# Patient Record
Sex: Female | Born: 2000 | Race: White | Hispanic: No | Marital: Single | Smoking: Former smoker
Health system: Southern US, Community
[De-identification: ages and names within clinical notes are randomized; demographics above are authoritative.]

## PROBLEM LIST (undated history)

## (undated) DIAGNOSIS — F32A Depression, unspecified: Secondary | ICD-10-CM

## (undated) DIAGNOSIS — F419 Anxiety disorder, unspecified: Secondary | ICD-10-CM

## (undated) HISTORY — DX: Depression, unspecified: F32.A

## (undated) HISTORY — DX: Anxiety disorder, unspecified: F41.9

---

## 2019-08-01 ENCOUNTER — Other Ambulatory Visit: Payer: Self-pay

## 2019-08-01 DIAGNOSIS — Z20822 Contact with and (suspected) exposure to covid-19: Secondary | ICD-10-CM

## 2019-08-03 LAB — NOVEL CORONAVIRUS, NAA: SARS-CoV-2, NAA: NOT DETECTED

## 2019-09-01 ENCOUNTER — Other Ambulatory Visit: Payer: Self-pay

## 2019-09-01 DIAGNOSIS — Z20822 Contact with and (suspected) exposure to covid-19: Secondary | ICD-10-CM

## 2019-09-03 LAB — NOVEL CORONAVIRUS, NAA: SARS-CoV-2, NAA: NOT DETECTED

## 2020-12-06 ENCOUNTER — Emergency Department
Admission: EM | Admit: 2020-12-06 | Discharge: 2020-12-06 | Disposition: A | Discharge status: Home / Self Care | Payer: Managed Care, Other (non HMO) | Attending: Emergency Medicine | Admitting: Emergency Medicine

## 2020-12-06 ENCOUNTER — Emergency Department: Payer: Managed Care, Other (non HMO)

## 2020-12-06 ENCOUNTER — Other Ambulatory Visit: Payer: Self-pay

## 2020-12-06 ENCOUNTER — Encounter: Payer: Self-pay | Admitting: Intensive Care

## 2020-12-06 DIAGNOSIS — M549 Dorsalgia, unspecified: Secondary | ICD-10-CM | POA: Insufficient documentation

## 2020-12-06 DIAGNOSIS — N3001 Acute cystitis with hematuria: Secondary | ICD-10-CM

## 2020-12-06 DIAGNOSIS — R110 Nausea: Secondary | ICD-10-CM | POA: Diagnosis not present

## 2020-12-06 DIAGNOSIS — Z87891 Personal history of nicotine dependence: Secondary | ICD-10-CM | POA: Insufficient documentation

## 2020-12-06 DIAGNOSIS — R1030 Lower abdominal pain, unspecified: Secondary | ICD-10-CM | POA: Diagnosis present

## 2020-12-06 LAB — COMPREHENSIVE METABOLIC PANEL
ALT: 17 U/L (ref 0–44)
AST: 22 U/L (ref 15–41)
Albumin: 4.7 g/dL (ref 3.5–5.0)
Alkaline Phosphatase: 74 U/L (ref 38–126)
Anion gap: 13 (ref 5–15)
BUN: 9 mg/dL (ref 6–20)
CO2: 24 mmol/L (ref 22–32)
Calcium: 9.6 mg/dL (ref 8.9–10.3)
Chloride: 99 mmol/L (ref 98–111)
Creatinine, Ser: 0.69 mg/dL (ref 0.44–1.00)
GFR, Estimated: >60 mL/min (ref >60)
Glucose, Bld: 97 mg/dL (ref 70–99)
Potassium: 4 mmol/L (ref 3.5–5.1)
Sodium: 136 mmol/L (ref 135–145)
Total Bilirubin: 0.9 mg/dL (ref 0.3–1.2)
Total Protein: 8.4 g/dL — ABNORMAL HIGH (ref 6.5–8.1)

## 2020-12-06 LAB — CBC
HCT: 40.2 % (ref 36.0–46.0)
Hemoglobin: 13.9 g/dL (ref 12.0–15.0)
MCH: 31.4 pg (ref 26.0–34.0)
MCHC: 34.6 g/dL (ref 30.0–36.0)
MCV: 90.7 fL (ref 80.0–100.0)
Platelets: 302 10*3/uL (ref 150–400)
RBC: 4.43 MIL/uL (ref 3.87–5.11)
RDW: 12 % (ref 11.5–15.5)
WBC: 13 10*3/uL — ABNORMAL HIGH (ref 4.0–10.5)
nRBC: 0 % (ref 0.0–0.2)

## 2020-12-06 LAB — POC URINE PREG, ED: Preg Test, Ur: NEGATIVE

## 2020-12-06 LAB — URINALYSIS, COMPLETE (UACMP) WITH MICROSCOPIC
Bilirubin Urine: NEGATIVE
Glucose, UA: NEGATIVE mg/dL
Ketones, ur: NEGATIVE mg/dL
Nitrite: NEGATIVE
Protein, ur: 100 mg/dL — AB
Specific Gravity, Urine: 1.01 (ref 1.005–1.030)
WBC, UA: >50 WBC/hpf — ABNORMAL HIGH (ref 0–5)
pH: 7 (ref 5.0–8.0)

## 2020-12-06 LAB — LIPASE, BLOOD: Lipase: 33 U/L (ref 11–51)

## 2020-12-06 MED ORDER — CEFTRIAXONE SODIUM 1 G IJ SOLR
1.0000 g | Freq: Once | INTRAMUSCULAR | Status: AC
  Administered 2020-12-06: 1 g via INTRAMUSCULAR
  Filled 2020-12-06: qty 10

## 2020-12-06 MED ORDER — PHENAZOPYRIDINE HCL 200 MG PO TABS: 200.0000 mg | ORAL_TABLET | Freq: Three times a day (TID) | ORAL | 0 refills | Status: AC | PRN

## 2020-12-06 MED ORDER — SULFAMETHOXAZOLE-TRIMETHOPRIM 800-160 MG PO TABS: 1.0000 | ORAL_TABLET | Freq: Two times a day (BID) | ORAL | 0 refills | Status: AC

## 2020-12-06 MED ORDER — LIDOCAINE HCL (PF) 1 % IJ SOLN
2.1000 mL | Freq: Once | INTRAMUSCULAR | Status: AC
  Administered 2020-12-06: 2.1 mL
  Filled 2020-12-06: qty 5

## 2020-12-06 NOTE — ED Provider Notes (Signed)
[facility] Emergency Department Provider Note ____________________________________________   Event Date/Time   First MD Initiated Contact with Patient 12/06/20 1548     (approximate)  I have reviewed the triage vital signs and the nursing notes.   HISTORY  Chief Complaint Abdominal Pain  HPI Angel Thompson is a 20 y.o. female with history of UTI symptoms for the past week presents to the emergency department for treatment and evaluation of lower abdominal pain and right side back pain. No relief with Azzo. Pain got worse overnight. She's having nausea without vomiting. No known fever. Pain in lower abdomen is worse with  Movement. Urinary frequency with pain post void.          History reviewed. No pertinent past medical history.  There are no problems to display for this patient.   History reviewed. No pertinent surgical history.  Prior to Admission medications   Medication Sig Start Date End Date Taking? Authorizing Provider  phenazopyridine (PYRIDIUM) 200 MG tablet Take 1 tablet (200 mg total) by mouth 3 (three) times daily as needed for pain. 12/06/20  Yes Elihu Milstein B, FNP  sulfamethoxazole-trimethoprim (BACTRIM DS) 800-160 MG tablet Take 1 tablet by mouth 2 (two) times daily for 3 days. 12/06/20 12/09/20 Yes Alianny Toelle B, FNP    Allergies Penicillins  History reviewed. No pertinent family history.  Social History Social History   Tobacco Use  . Smoking status: Former Smoker    Types: E-cigarettes  . Smokeless tobacco: Never Used  Substance Use Topics  . Alcohol use: Yes    Alcohol/week: 6.0 standard drinks    Types: 6 Cans of beer per week  . Drug use: Never    Review of Systems  Constitutional: No fever/chills Eyes: No visual changes. ENT: No sore throat. Cardiovascular: Denies chest pain. Respiratory: Denies shortness of breath. Gastrointestinal: Positive for abdominal pain.  Positive for nausea, no vomiting.  No  diarrhea. Positive for constipation. Genitourinary: Positive for dysuria. Musculoskeletal: Positive for back pain. Skin: Negative for rash. Neurological: Negative for headaches, focal weakness or numbness. ____________________________________________   PHYSICAL EXAM:  VITAL SIGNS: ED Triage Vitals  Enc Vitals Group     BP 12/06/20 1436 (!) 145/98     Pulse Rate 12/06/20 1436 (!) 103     Resp 12/06/20 1436 16     Temp 12/06/20 1436 98.4 F (36.9 C)     Temp Source 12/06/20 1436 Oral     SpO2 12/06/20 1436 99 %     Weight 12/06/20 1438 140 lb (63.5 kg)     Height 12/06/20 1438 5' 3.5" (1.613 m)     Head Circumference --      Peak Flow --      Pain Score 12/06/20 1437 5     Pain Loc --      Pain Edu? --      Excl. in GC? --     Constitutional: Alert and oriented. Well appearing and in no acute distress. Eyes: Conjunctivae are normal. PERRL. EOMI. Head: Atraumatic. Nose: No congestion/rhinnorhea. Mouth/Throat: Mucous membranes are moist.  Oropharynx non-erythematous. Neck: No stridor.   Hematological/Lymphatic/Immunilogical: No cervical lymphadenopathy. Cardiovascular: Normal rate, regular rhythm. Grossly normal heart sounds.  Good peripheral circulation. Respiratory: Normal respiratory effort.  No retractions. Lungs CTAB. Gastrointestinal: Soft with suprapubic tenderness. No distention. No abdominal bruits. Right side CVA tenderness. Genitourinary:  Musculoskeletal: No lower extremity tenderness nor edema.  No joint effusions. Neurologic:  Normal speech and language. No gross focal neurologic  deficits are appreciated. No gait instability. Skin:  Skin is warm, dry and intact. No rash noted. Psychiatric: Mood and affect are normal. Speech and behavior are normal.  ____________________________________________   LABS (all labs ordered are listed, but only abnormal results are displayed)  Labs Reviewed  COMPREHENSIVE METABOLIC PANEL - Abnormal; Notable for the following  components:      Result Value   Total Protein 8.4 (*)    All other components within normal limits  CBC - Abnormal; Notable for the following components:   WBC 13.0 (*)    All other components within normal limits  URINALYSIS, COMPLETE (UACMP) WITH MICROSCOPIC - Abnormal; Notable for the following components:   Color, Urine YELLOW (*)    APPearance CLOUDY (*)    Hgb urine dipstick LARGE (*)    Protein, ur 100 (*)    Leukocytes,Ua LARGE (*)    WBC, UA >50 (*)    Bacteria, UA FEW (*)    All other components within normal limits  LIPASE, BLOOD  POC URINE PREG, ED   ____________________________________________  EKG  Not indicated. ____________________________________________  RADIOLOGY  ED MD interpretation:    CT renal stone study negative.  I, Kem Boroughs, personally viewed and evaluated these images (plain radiographs) as part of my medical decision making, as well as reviewing the written report by the radiologist.  Official radiology report(s): CT Renal Stone Study  Result Date: 12/06/2020 CLINICAL DATA:  Right-sided flank pain EXAM: CT ABDOMEN AND PELVIS WITHOUT CONTRAST TECHNIQUE: Multidetector CT imaging of the abdomen and pelvis was performed following the standard protocol without IV contrast. COMPARISON:  None. FINDINGS: Lower chest: No acute abnormality. Hepatobiliary: No focal liver abnormality is seen. No gallstones, gallbladder wall thickening, or biliary dilatation. Pancreas: Unremarkable. No pancreatic ductal dilatation or surrounding inflammatory changes. Spleen: Normal in size without focal abnormality. Adrenals/Urinary Tract: Adrenal glands are within normal limits. Kidneys demonstrate a normal appearance bilaterally. No renal calculi or obstructive changes are seen. The bladder is well distended. Stomach/Bowel: The appendix is not well seen although no inflammatory changes to suggest appendicitis are noted. No obstructive or inflammatory changes of colon are  seen. The small bowel and stomach are within normal limits. Vascular/Lymphatic: No significant vascular findings are present. No enlarged abdominal or pelvic lymph nodes. Reproductive: Uterus and bilateral adnexa are unremarkable. Other: No abdominal wall hernia or abnormality. No abdominopelvic ascites. Musculoskeletal: No acute or significant osseous findings. IMPRESSION: No acute abnormality noted. Electronically Signed   By: Alcide Clever M.D.   On: 12/06/2020 16:32    ____________________________________________   PROCEDURES  Procedure(s) performed (including Critical Care):  Procedures  ____________________________________________   INITIAL IMPRESSION / ASSESSMENT AND PLAN     20 year old female presents to the ER for evaluation of abdominal pain, dysuria, and right flank pain. See HPI. Plan will be to review labs, urinalysis collected while awaiting ER room assignment.  DIFFERENTIAL DIAGNOSIS  Acute cystitis, pyelonephritis, nephrolithiasis  ED COURSE  CT is reassuring. No evidence of kidney stone, pyelonephritis, or appendicitis.  She will be treated with Rocephin while here and given 3 days of Bactrim. Pyridium should help with symptoms.   She was advised to follow up with PCP or return to the ER for symptoms that change or worsen if unable to schedule an appointment.    ___________________________________________   FINAL CLINICAL IMPRESSION(S) / ED DIAGNOSES  Final diagnoses:  Acute cystitis with hematuria     ED Discharge Orders  Ordered    sulfamethoxazole-trimethoprim (BACTRIM DS) 800-160 MG tablet  2 times daily        12/06/20 1642    phenazopyridine (PYRIDIUM) 200 MG tablet  3 times daily PRN        12/06/20 1642           Rashawnda Bardwell was evaluated in Emergency Department on 12/06/2020 for the symptoms described in the history of present illness. She was evaluated in the context of the global COVID-19 pandemic, which necessitated  consideration that the patient might be at risk for infection with the SARS-CoV-2 virus that causes COVID-19. Institutional protocols and algorithms that pertain to the evaluation of patients at risk for COVID-19 are in a state of rapid change based on information released by regulatory bodies including the CDC and federal and state organizations. These policies and algorithms were followed during the patient's care in the ED.   Note:  This document was prepared using Dragon voice recognition software and may include unintentional dictation errors.   Chinita Pester, FNP 12/06/20 1647    Sharyn Creamer, MD 12/06/20 1754

## 2020-12-06 NOTE — ED Triage Notes (Signed)
Patient c/o right lower abdominal pain and right lower back pain with nausea. Reports constipation. All started a few days ago. Urgent care sent patient here for possible appendicitis.

## 2020-12-06 NOTE — Discharge Instructions (Signed)
Take tylenol or ibuprofen in addition to the pyridium if needed for pain.  Return to the ER or see primary care if needed for symptoms that change or worsen.

## 2022-08-26 IMAGING — CT CT RENAL STONE PROTOCOL
3 of 4 series · 9 of 46 positions shown, 16 images · non-contrast
Comparison: None.

CLINICAL DATA: Right-sided flank pain

EXAM:
CT ABDOMEN AND PELVIS WITHOUT CONTRAST
TECHNIQUE: Multidetector CT imaging of the abdomen and pelvis was performed
following the standard protocol without IV contrast.

[Series 4: lung bases · axial · 0.78mm/px · z∈[-174,-99]mm · 5 of 23 slices shown, 10 images]
[im 4/23  soft-tissue]
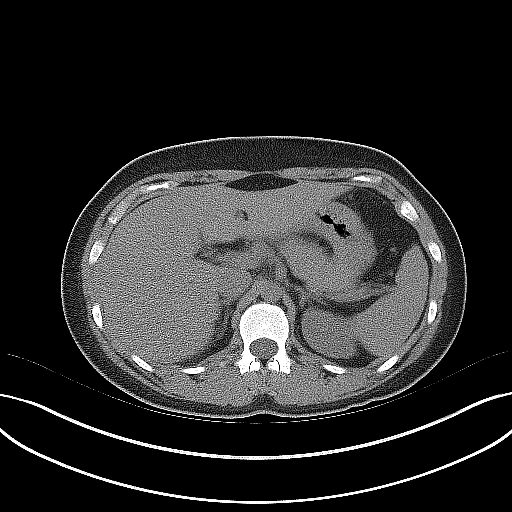
[im 4/23  bone]
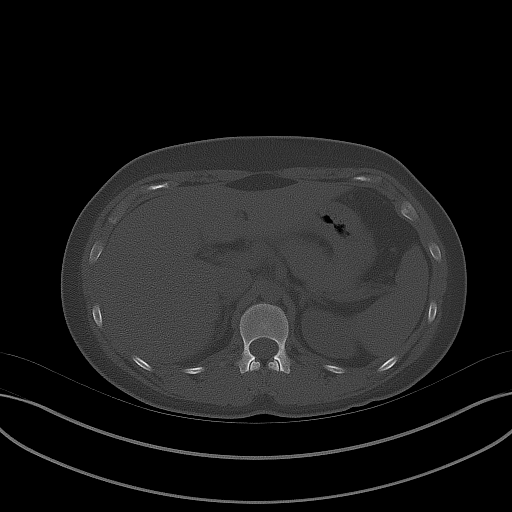
[im 8/23  soft-tissue]
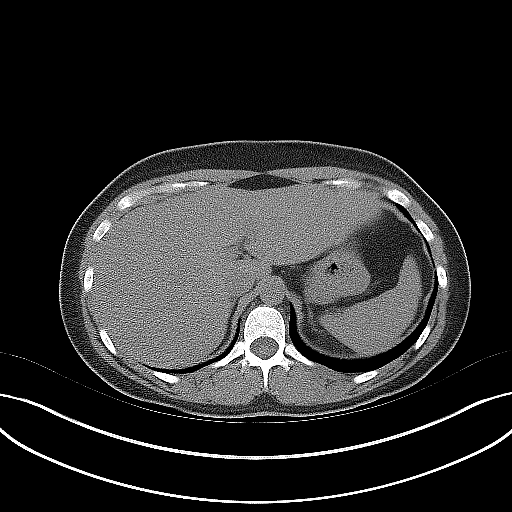
[im 8/23  lung]
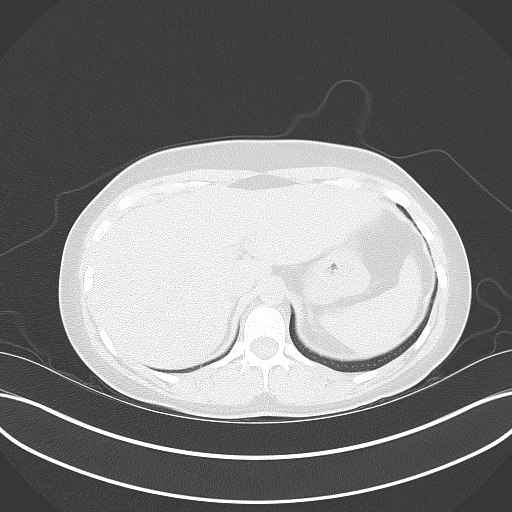
[im 12/23  soft-tissue]
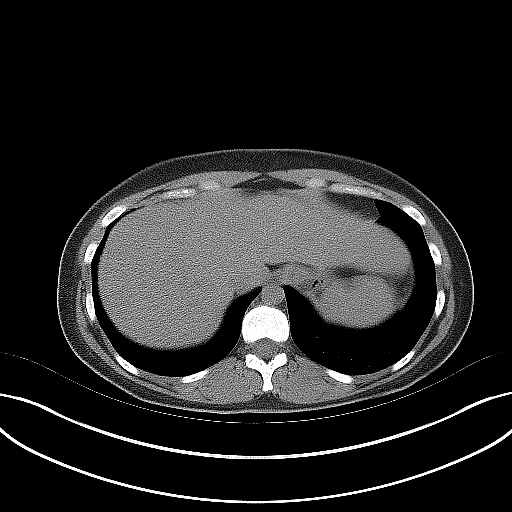
[im 12/23  lung]
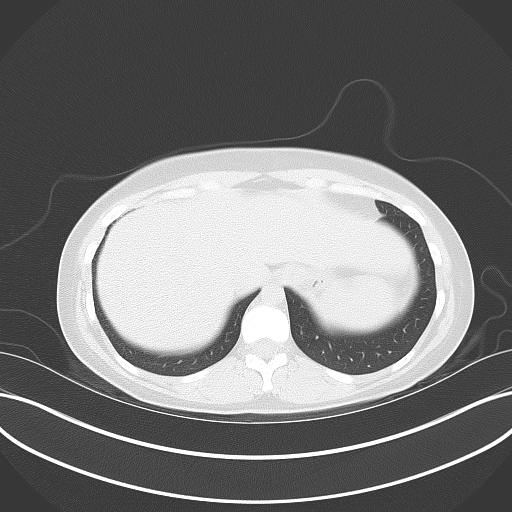
[im 15/23  soft-tissue]
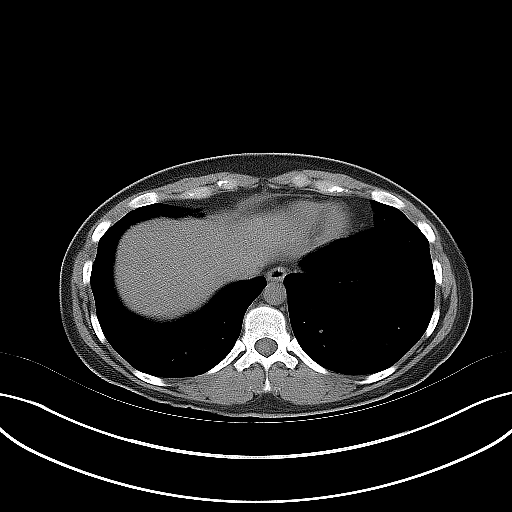
[im 15/23  lung]
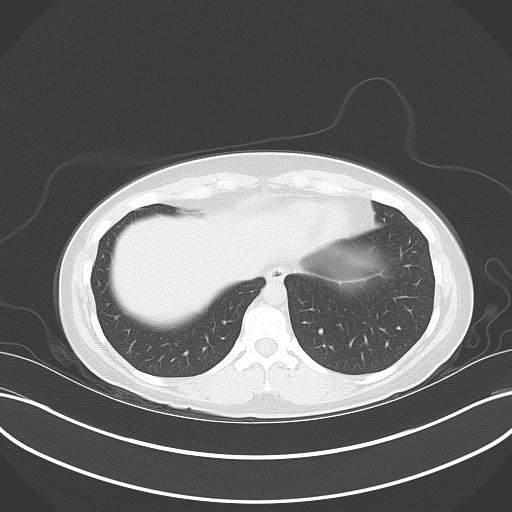
[im 19/23  soft-tissue]
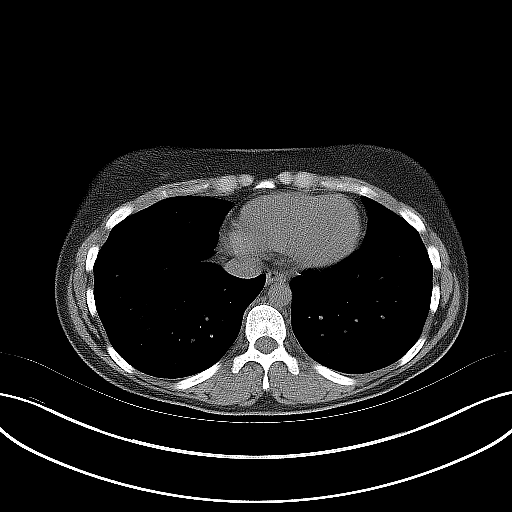
[im 19/23  lung]
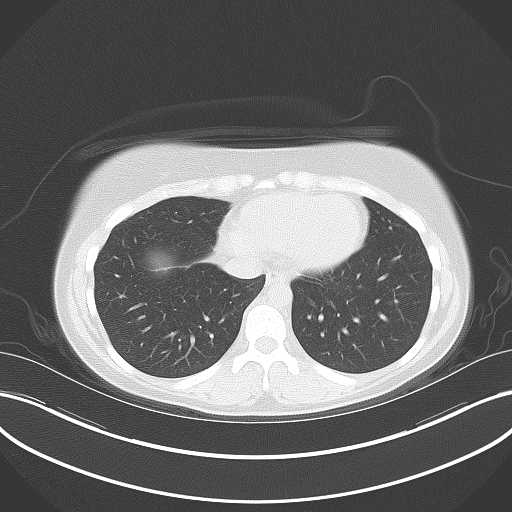

[Series 5: coronal · coronal · 0.82mm/px · 3 of 108 slices shown, 4 images]
[im 36/108  soft-tissue]
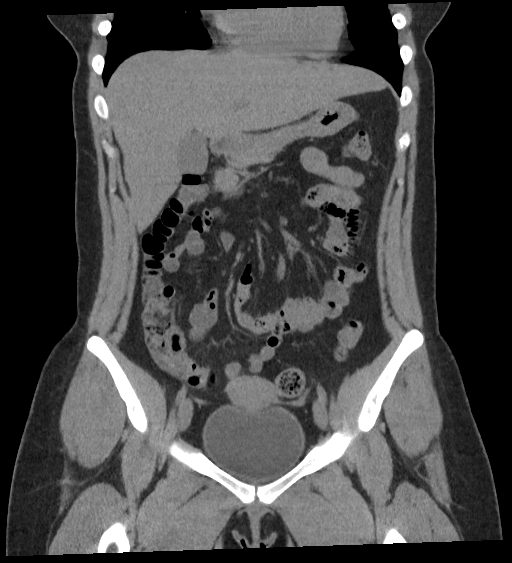
[im 48/108  soft-tissue]
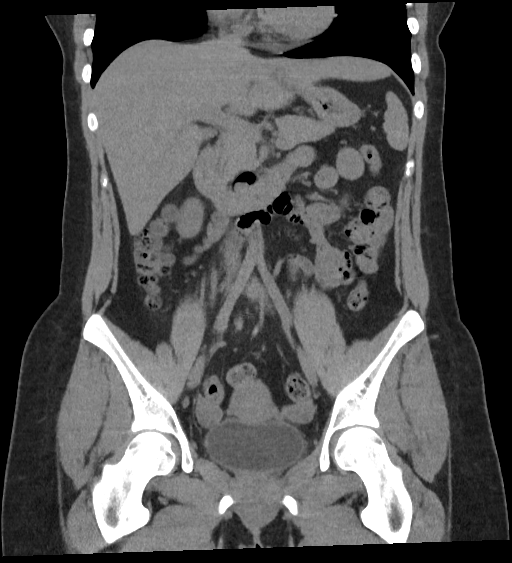
[im 48/108  bone]
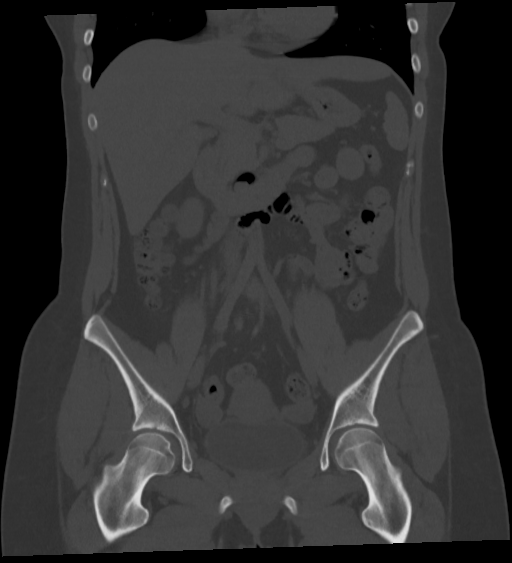
[im 60/108  soft-tissue]
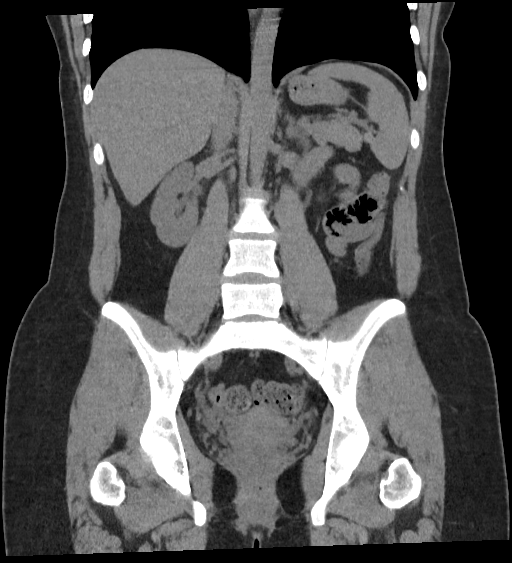

[Series 6: sagittal · sagittal · 0.48mm/px · 1 of 188 slices shown, 2 images]
[im 63/188  soft-tissue]
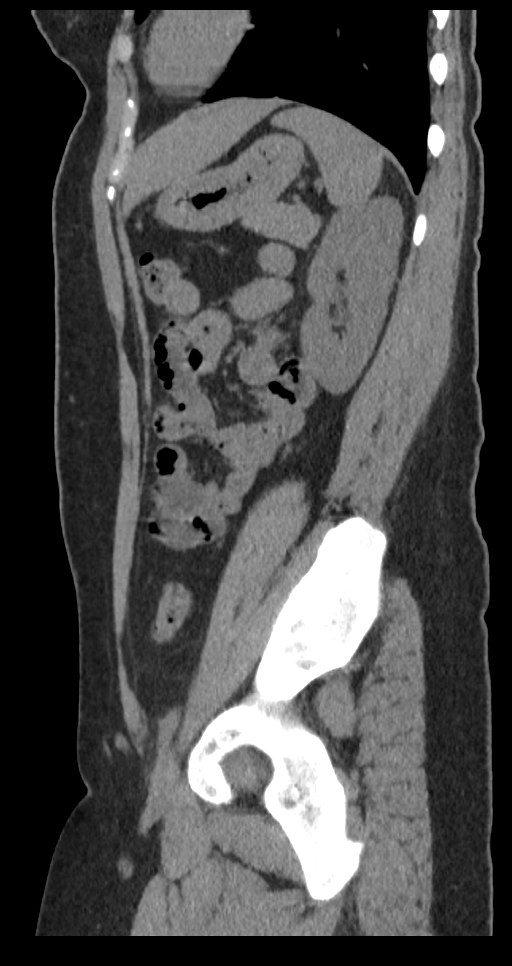
[im 63/188  bone]
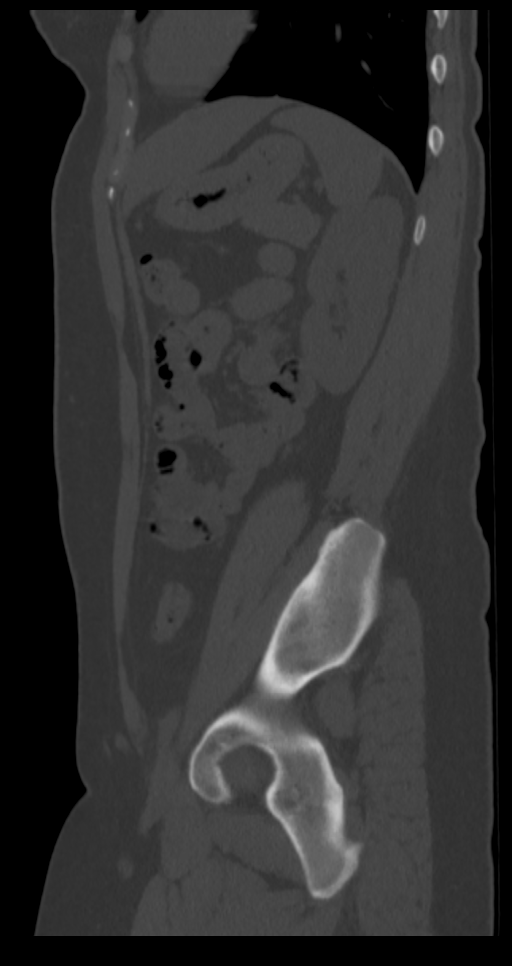

[9 of 46 positions shown; findings below may reference images not displayed]

FINDINGS: Lower chest: No acute abnormality.

Hepatobiliary: No focal liver abnormality is seen. No gallstones,
gallbladder wall thickening, or biliary dilatation.

Pancreas: Unremarkable. No pancreatic ductal dilatation or
surrounding inflammatory changes.

Spleen: Normal in size without focal abnormality.

Adrenals/Urinary Tract: Adrenal glands are within normal limits.
Kidneys demonstrate a normal appearance bilaterally. No renal
calculi or obstructive changes are seen. The bladder is well
distended.

Stomach/Bowel: The appendix is not well seen although no
inflammatory changes to suggest appendicitis are noted. No
obstructive or inflammatory changes of colon are seen. The small
bowel and stomach are within normal limits.

Vascular/Lymphatic: No significant vascular findings are present. No
enlarged abdominal or pelvic lymph nodes.

Reproductive: Uterus and bilateral adnexa are unremarkable.

Other: No abdominal wall hernia or abnormality. No abdominopelvic
ascites.

Musculoskeletal: No acute or significant osseous findings.
IMPRESSION: No acute abnormality noted.

## 2023-01-21 ENCOUNTER — Encounter (INDEPENDENT_AMBULATORY_CARE_PROVIDER_SITE_OTHER): Payer: Self-pay

## 2023-01-21 ENCOUNTER — Ambulatory Visit (INDEPENDENT_AMBULATORY_CARE_PROVIDER_SITE_OTHER): Payer: Commercial Managed Care - POS | Admitting: Family Nurse Practitioner

## 2023-01-21 VITALS — BP 129/83 | HR 76 | Temp 98.3°F | Resp 16 | Ht 63.0 in | Wt 170.0 lb

## 2023-01-21 DIAGNOSIS — H1033 Unspecified acute conjunctivitis, bilateral: Secondary | ICD-10-CM

## 2023-01-21 MED ORDER — POLYMYXIN B-TRIMETHOPRIM 10000-0.1 UNIT/ML-% OP SOLN
2.0000 [drp] | Freq: Four times a day (QID) | OPHTHALMIC | 0 refills | Status: AC
Start: 2023-01-21 — End: 2023-01-26

## 2023-01-21 NOTE — Progress Notes (Signed)
Urgent Care Provider Note    Patient: [employer]   Date: 01/21/2023   MRN: [id number]       Subjective     Chief Complaint   Patient presents with    Eye Problem     Redness. 3 days. No Med OTC         Eye Problem         Tabitha Manoukian is a 22 y.o. female arrived to the office today with c/o nasal congestion, mild cough and bilateral eye redness with discharge. Patient stated that she woke up this AM with yellow discharge in her eyes with continued eye redness. She stated that she had whipped them clean but they remain red. Stated she does not have any continued discharge from the eye but they are red and itchy. She denies use of contacts but does wear glasses. No close contacts that are sick.       Pertinent Past Medical, Surgical, Family and Social History were reviewed.      Current Outpatient Medications:     trimethoprim-polymyxin b (POLYTRIM) ophthalmic solution, Place 2 drops into both eyes every 6 (six) hours for 5 days, Disp: 10 mL, Rfl: 0    Allergies   Allergen Reactions    Augmentin [Amoxicillin-Pot Clavulanate] Rash       Medications and Allergies reviewed.         Objective     Vitals:    01/21/23 1125   BP: 129/83   Pulse: 76   Resp: 16   Temp: 98.3 F (36.8 C)   SpO2: 98%       Physical Exam  Vitals and nursing note reviewed.   Constitutional:       Appearance: Normal appearance.   HENT:      Head: Normocephalic.      Nose: Congestion present.      Mouth/Throat:      Mouth: Mucous membranes are moist.   Eyes:      Comments: Bilateral eye redness. No photophobia. No discharge noted at this time.    Neurological:      Mental Status: She is alert.   Psychiatric:         Behavior: Behavior is cooperative.          General: no acute distress, well developed, well nourished.    Eyes: no conjunctival injection or discharge    Ear: TMs clear bilaterally, normal light reflexes, clear canals without cerumen impaction    Throat: moist mucous membranes, no erythema, no exudates, no tonsillar swelling, no  drooling, no PTA    Neck: supple, no cervical adenopathy    Lung: no respiratory distress, clear to auscultation bilaterally, no rales, no wheeze, no rhonchi    Heart: regular rate and rhythm, no murmurs    Abdomen: soft, non-distended, nontender, normoactive bowel sounds, no rebound, no guarding    Neurologic: alert, oriented, speech clear    UCC COURSE  There were no labs reviewed with this patient during the visit.    There were no x-rays reviewed with this patient during the visit.    No current facility-administered medications for this visit.             Procedures  None      MDM:  Allergic Conjunctivitis  Viral Conjunctivitis  Uveitis  Dry Eye  Blepharitis      Assessment       Jacie was seen today for eye problem.    Diagnoses and all orders  for this visit:    Acute bacterial conjunctivitis of both eyes  -     trimethoprim-polymyxin b (POLYTRIM) ophthalmic solution; Place 2 drops into both eyes every 6 (six) hours for 5 days      Discussed watch and wait with the antibiotic if the discharge does not return could be related to cold symptoms. Advised to use warm compresses as needed for irritation. Motrin or Tylenol for pain. Antihistamine for itchy eyes.     Plan and follow-up discussed with patient. See AVS for further documentation.

## 2023-01-21 NOTE — Patient Instructions (Signed)
Follow up as needed

## 2023-03-08 ENCOUNTER — Encounter (INDEPENDENT_AMBULATORY_CARE_PROVIDER_SITE_OTHER): Payer: Self-pay | Admitting: Medical-Surgical

## 2023-03-08 ENCOUNTER — Ambulatory Visit (INDEPENDENT_AMBULATORY_CARE_PROVIDER_SITE_OTHER): Payer: Commercial Managed Care - POS | Admitting: Medical-Surgical

## 2023-03-08 VITALS — BP 122/76 | HR 81 | Temp 97.6°F | Wt 172.0 lb

## 2023-03-08 DIAGNOSIS — M542 Cervicalgia: Secondary | ICD-10-CM

## 2023-03-08 DIAGNOSIS — Z0000 Encounter for general adult medical examination without abnormal findings: Secondary | ICD-10-CM

## 2023-03-08 DIAGNOSIS — Z8349 Family history of other endocrine, nutritional and metabolic diseases: Secondary | ICD-10-CM

## 2023-03-08 DIAGNOSIS — Z1159 Encounter for screening for other viral diseases: Secondary | ICD-10-CM

## 2023-03-08 LAB — COMPREHENSIVE METABOLIC PANEL
ALT: 31 U/L (ref 0–55)
AST (SGOT): 21 U/L (ref 5–41)
Albumin/Globulin Ratio: 1.5 (ref 0.9–2.2)
Albumin: 4.5 g/dL (ref 3.5–5.0)
Alkaline Phosphatase: 106 U/L (ref 37–117)
Anion Gap: 12 (ref 5.0–15.0)
BUN: 14 mg/dL (ref 7.0–21.0)
Bilirubin, Total: 0.7 mg/dL (ref 0.2–1.2)
CO2: 24 mEq/L (ref 17–29)
Calcium: 10.2 mg/dL (ref 8.5–10.5)
Chloride: 101 mEq/L (ref 99–111)
Creatinine: 0.7 mg/dL (ref 0.4–1.0)
Globulin: 3.1 g/dL (ref 2.0–3.6)
Glucose: 89 mg/dL (ref 70–100)
Potassium: 4.7 mEq/L (ref 3.5–5.3)
Protein, Total: 7.6 g/dL (ref 6.0–8.3)
Sodium: 137 mEq/L (ref 135–145)
eGFR: >60 mL/min/{1.73_m2} (ref >=60)

## 2023-03-08 LAB — CBC AND DIFFERENTIAL
Absolute NRBC: 0 10*3/uL (ref 0.00–0.00)
Basophils Absolute Automated: 0.03 10*3/uL (ref 0.00–0.08)
Basophils Automated: 0.4 %
Eosinophils Absolute Automated: 0.09 10*3/uL (ref 0.00–0.44)
Eosinophils Automated: 1.3 %
Hematocrit: 44 % — ABNORMAL HIGH (ref 34.7–43.7)
Hgb: 15 g/dL — ABNORMAL HIGH (ref 11.4–14.8)
Immature Granulocytes Absolute: 0.01 10*3/uL (ref 0.00–0.07)
Immature Granulocytes: 0.1 %
Instrument Absolute Neutrophil Count: 3.86 10*3/uL (ref 1.10–6.33)
Lymphocytes Absolute Automated: 2.23 10*3/uL (ref 0.42–3.22)
Lymphocytes Automated: 33.3 %
MCH: 31.2 pg (ref 25.1–33.5)
MCHC: 34.1 g/dL (ref 31.5–35.8)
MCV: 91.5 fL (ref 78.0–96.0)
MPV: 10.2 fL (ref 8.9–12.5)
Monocytes Absolute Automated: 0.47 10*3/uL (ref 0.21–0.85)
Monocytes: 7 %
Neutrophils Absolute: 3.86 10*3/uL (ref 1.10–6.33)
Neutrophils: 57.9 %
Nucleated RBC: 0 /100 WBC (ref 0.0–0.0)
Platelets: 301 10*3/uL (ref 142–346)
RBC: 4.81 10*6/uL (ref 3.90–5.10)
RDW: 12 % (ref 11–15)
WBC: 6.69 10*3/uL (ref 3.10–9.50)

## 2023-03-08 LAB — LIPID PANEL
Cholesterol / HDL Ratio: 5 Index
Cholesterol: 223 mg/dL — ABNORMAL HIGH (ref 0–199)
HDL: 45 mg/dL (ref 40–9999)
LDL Calculated: 164 mg/dL — ABNORMAL HIGH (ref 0–99)
Triglycerides: 69 mg/dL (ref 34–149)
VLDL Calculated: 14 mg/dL (ref 10–40)

## 2023-03-08 LAB — HEMOLYSIS INDEX(SOFT): Hemolysis Index: 5 Index (ref 0–24)

## 2023-03-08 LAB — HEMOGLOBIN A1C
Average Estimated Glucose: 102.5 mg/dL
Hemoglobin A1C: 5.2 % (ref 4.6–5.6)

## 2023-03-08 LAB — HEPATITIS C ANTIBODY, TOTAL: Hepatitis C, AB: NONREACTIVE

## 2023-03-08 LAB — TSH: TSH: 1.79 u[IU]/mL (ref 0.35–4.94)

## 2023-03-08 NOTE — Progress Notes (Signed)
Date of Exam: 03/08/2023 12:16 PM        Patient ID: Priscilla Sims is a 22 y.o. female.  Attending Physician: Jodell Cipro, DNP FNP        Chief Complaint:    Chief Complaint   Patient presents with    Annual Exam               HPI:    HPI  Visit Type: Health Maintenance Visit  Pt presents today for annual physical.  Fasting - yes      Diet is healthy, well-balanced with normal portions? Overall okay   Exercising regularly?  4 days x week   Normal sleep patterns?  7-9 hours x night   Overall mood:  PHQ score: 10 (Mod Depression)     IMMUNIZATIONS - see form  Tdap - n/a  Flu- UTD   COVID- n/a     PREVENTATIVE SCREENINGS  MOST RECENT:   Dental Exam - more than a year ago   Eye Exam - 2022  Hearing concerns?  No   Colonoscopy/FIT (over age 45)-  no fam hx   Pap smear (over age 43)-  sees GYN 04/2022  Mammogram (over age 37)-  no fam hx     Additional concerns:   - pt would like Thyroid bw   - complaint - neck pain, tender to touch                Problem List:    There is no problem list on file for this patient.            Current Meds:    No outpatient medications have been marked as taking for the 03/08/23 encounter (Office Visit) with Jodell Cipro, DNP FNP.          Allergies:    Allergies   Allergen Reactions    Augmentin [Amoxicillin-Pot Clavulanate] Rash    Pollen Extract              Past Surgical History:    History reviewed. No pertinent surgical history.        Family History:    Family History   Problem Relation Age of Onset    Diabetes Maternal Grandfather     Cancer Paternal Grandfather            Social History:    Social History     Tobacco Use    Smoking status: Never    Smokeless tobacco: Former   Haematologist status: Former   Substance Use Topics    Alcohol use: Yes     Comment: weekends    Drug use: Yes     Types: Marijuana     Comment: 3-5/week           The following sections were reviewed this encounter by the provider:   Tobacco  Allergies  Meds  Problems   Med Hx  Surg Hx  Fam Hx             Vital Signs:    BP 122/76   Pulse 81   Temp 97.6 F (36.4 C)   Wt 78 kg (172 lb)   LMP 01/26/2023 (Approximate)   SpO2 98%   BMI 30.47 kg/m          ROS:    Review of Systems   Musculoskeletal:         Right sided neck tenderness      General/Constitutional:  Denies Chills. Denies Fever.   Ophthalmologic:   Denies Blurred vision.   ENT:   Denies Ear pain. Denies Nosebleed. Denies Sinus pain. Denies Sore throat.   Endocrine:   Denies Polydipsia. Denies Polyuria. Denies Weakness.   Respiratory:   Denies Shortness of breath. Denies Wheezing.   Cardiovascular:   Denies Chest pain. Denies Chest pain with exertion. Denies Leg Claudication.    Gastrointestinal:   Denies Abdominal pain. Denies Blood in stool.  Hematology:   Denies Easy bruising. Denies Easy Bleeding. Denies Swollen glands.   Genitourinary:   Denies Blood in urine. Denies Painful urination.   Musculoskeletal:   Denies Joint pain. Denies Joint stiffness. Denies Swollen joints.   Skin:   Denies Itching. Denies Change in Mole(s). Denies Rash.   Neurologic:   Denies Balance difficulty Denies Gait abnormality. Denies Pre-Syncope.              Physical Exam:    Physical Exam  Lymphadenopathy:      Cervical: Cervical adenopathy present.      Right cervical: Superficial cervical adenopathy present.        GENERAL APPEARANCE: alert, in no acute distress, well developed, well nourished, oriented to time, place, and person.   HEAD: normal appearance, atraumatic.   EYES: extraocular movement intact (EOMI), sclera anicteric, conjunctiva clear.   EARS: hearing normal, external canals normal .   NECK/THYROID: neck supple, no cervical lymphadenopathy, no neck mass  LYMPH NODES: no palpable adenopathy.   SKIN: good turgor, no rashes, no suspicious lesions.   HEART: S1, S2 normal, regular rate and rhythm.   LUNGS: normal effort / no distress, normal breath sounds, clear to auscultation   ABDOMEN: bowel sounds present, no  hepatosplenomegaly, soft, nontender  MUSCULOSKELETAL: full range of motion, no swelling or deformity.   EXTREMITIES: no edema, no clubbing, cyanosis, or edema.   NEUROLOGIC: nonfocal, cranial nerves 2-12 grossly intact, normal strength  PSYCH: cognitive function intact, mood/affect full range, speech clear.         Assessment:    1. Tenderness of neck  - [place] Head Neck Soft Tissue; Future    2. Family history of thyroid disease  - [place] Head Neck Soft Tissue; Future    3. Encounter for annual physical exam  - Comprehensive metabolic panel  - Lipid panel  - TSH  - CBC and differential  - Hemoglobin A1C    4. Need for hepatitis C screening test  - Hepatitis C (HCV) antibody, Total            Plan:    - This is a pleasant 22 year old female  - presents today for well women exam   - UTD with pap smear  - UTD with vaccines  - normal physical exam  - screening bw obtained  - pt with right sided tenderness on neck x 3-4 days. Pt also has fam hx of thyroid disease. Will check labs and imaging     Health Maintenance:   - Follow active lifestyle  - Diet rich in fruits/vegetables, avoid snacks and processed foods.  - Healthy sleep patterns  - Avoid excess alcohol intake  - Follow up as needed            Follow-up:    No follow-ups on file.         Jodell Cipro, DNP FNP

## 2023-03-09 NOTE — Progress Notes (Signed)
Hi Ms. Fiorello:  Laboratory results from your recent lab visit have returned for your review. (Minor variances, if any, outside of the expected range are not considered clinically significant)    The following lab results are within acceptable limits:   - A1C ( 3 month measure of glucose control ) is within normal limits.  There is no evidence of diabetes.  - (CBC) The complete blood count is within acceptable limits.  There is no evidence of anemia.  White blood cell count and platelet count are within normal limits.  - (CMP) Liver and kidney function are within acceptable limits.  Electrolytes and glucose are within acceptable limits.  - TSH ( a screen for thyroid disease) is within normal limits.   - LDL (bad cholesterol) is moderately elevated.  HDL (good cholesterol) is within normal limits. Triglycerides are within normal limits.   - Hepatitis C screening test (HCV Ab Total) is recommended once for patients ages 18-79 yrs.  There is no evidence of Hepatitis C infection.      Continue to optimize low carbohydrate diet and exercise efforts to help lower the LDL (bad cholesterol).    Regards,  Jodell Cipro, DNP FNP

## 2023-03-16 ENCOUNTER — Other Ambulatory Visit (INDEPENDENT_AMBULATORY_CARE_PROVIDER_SITE_OTHER): Payer: Self-pay

## 2023-03-16 ENCOUNTER — Ambulatory Visit (INDEPENDENT_AMBULATORY_CARE_PROVIDER_SITE_OTHER): Payer: Commercial Managed Care - POS | Admitting: Medical-Surgical

## 2023-03-16 DIAGNOSIS — M542 Cervicalgia: Secondary | ICD-10-CM

## 2023-03-16 DIAGNOSIS — Z8349 Family history of other endocrine, nutritional and metabolic diseases: Secondary | ICD-10-CM

## 2023-03-19 ENCOUNTER — Encounter (INDEPENDENT_AMBULATORY_CARE_PROVIDER_SITE_OTHER): Payer: Commercial Managed Care - POS | Admitting: Medical-Surgical

## 2023-03-19 ENCOUNTER — Encounter (INDEPENDENT_AMBULATORY_CARE_PROVIDER_SITE_OTHER): Payer: Self-pay | Admitting: Medical-Surgical

## 2023-03-19 NOTE — Progress Notes (Unsigned)
[facility] [place] [facility]                       Date of Exam: 03/19/2023 1:26 PM        Patient ID: Priscilla Sims is a 22 y.o. female.  Attending Physician: Jodell Cipro, DNP FNP        Chief Complaint:    No chief complaint on file.              HPI:    Pt presents today to discuss antidepressant medications    - pt restarted therapy 4 months ago   -  therapist is recommending start pt on meds               Problem List:    There is no problem list on file for this patient.            Current Meds:    Outpatient Medications Marked as Taking for the 03/19/23 encounter (Office Visit) with Jodell Cipro, DNP FNP   Medication Sig Dispense Refill    levonorgestrel (MIRENA) 20 MCG/DAY IUD 1 Intra Uterine Device (52 mg) by Intrauterine route once            Allergies:    Allergies   Allergen Reactions    Augmentin [Amoxicillin-Pot Clavulanate] Rash    Pollen Extract              Past Surgical History:    No past surgical history on file.        Family History:    Family History   Problem Relation Age of Onset    Diabetes Maternal Grandfather     Cancer Paternal Grandfather            Social History:    Social History     Tobacco Use    Smoking status: Never    Smokeless tobacco: Former   Haematologist status: Former   Substance Use Topics    Alcohol use: Yes     Comment: weekends    Drug use: Yes     Types: Marijuana     Comment: 3-5/week           The following sections were reviewed this encounter by the provider:            Vital Signs:    Temp 97.8 F (36.6 C)   Wt 78.5 kg (173 lb)   LMP 02/26/2023 (Approximate)   BMI 30.65 kg/m          ROS:    Review of Systems           Physical Exam:    Physical Exam         Assessment/Plan:    There are no diagnoses linked to this encounter.                Follow-up:    No follow-ups on file.         Jodell Cipro, DNP FNP

## 2023-06-26 ENCOUNTER — Encounter (INDEPENDENT_AMBULATORY_CARE_PROVIDER_SITE_OTHER): Payer: Self-pay

## 2023-06-26 ENCOUNTER — Ambulatory Visit (INDEPENDENT_AMBULATORY_CARE_PROVIDER_SITE_OTHER): Payer: Commercial Managed Care - POS | Admitting: Family

## 2023-06-26 VITALS — BP 120/74 | HR 59 | Temp 97.8°F | Wt 180.0 lb

## 2023-06-26 DIAGNOSIS — R519 Headache, unspecified: Secondary | ICD-10-CM

## 2023-06-26 DIAGNOSIS — G43109 Migraine with aura, not intractable, without status migrainosus: Secondary | ICD-10-CM

## 2023-06-26 MED ORDER — BUTALBITAL-APAP-CAFFEINE 50-325-40 MG PO TABS
1.0000 | ORAL_TABLET | ORAL | 1 refills | Status: AC | PRN
Start: 2023-06-26 — End: ?

## 2023-06-26 MED ORDER — CYCLOBENZAPRINE HCL 10 MG PO TABS: 10.0000 mg | ORAL_TABLET | Freq: Three times a day (TID) | ORAL | 0 refills | Status: AC | PRN

## 2023-06-26 NOTE — Progress Notes (Signed)
[facility] - AN [place] [facility]                       Date of Exam: 06/26/2023 12:13 PM        Patient ID: Priscilla Sims is a 22 y.o. female.  Attending Physician: Sherryle Lis, FNP        Chief Complaint:    Chief Complaint   Patient presents with    Headache               HPI:    Pt presents today to discuss headaches:  -onset: 9 days ago   -frequency: fluctuating   -pain location: back of head and crowing headache   -pain radiates to:  -pain quality: pulsating, shooting   -associated sxs: bilateral eye pain, insomnia, Nausea, photophobia,  loss of vision, vomiting, neck and shoulder tightness  -treatments tried: Excedrin   -improvement on treatment: none                 Problem List:    Problem List[1]          Current Meds:    Medications Taking[2]       Allergies:    Allergies[3]          Past Surgical History:    History reviewed. No pertinent surgical history.        Family History:    Family History   Problem Relation Age of Onset    Diabetes Maternal Grandfather     Cancer Paternal Grandfather            Social History:    Social History[4]        The following sections were reviewed this encounter by the provider:   Tobacco  Allergies  Meds  Problems  Med Hx  Surg Hx  Fam Hx             Vital Signs:    BP 120/74   Pulse (!) 59   Temp 97.8 F (36.6 C)   Wt 81.6 kg (180 lb)   LMP 06/23/2023 (Approximate)   SpO2 100%   BMI 31.89 kg/m          ROS:    Review of Systems   Constitutional:  Positive for fatigue. Negative for chills and fever.   Eyes:  Positive for photophobia (with migraines) and visual disturbance (missing spots/erased in vision with migraines).   Respiratory:  Negative for cough.    Cardiovascular:  Negative for chest pain.   Gastrointestinal:  Positive for nausea. Negative for abdominal pain and vomiting (last Tuesday).   Musculoskeletal:  Positive for neck pain and neck stiffness.   Neurological:  Positive for dizziness (mild), light-headedness  (mild), numbness (hands and lips) and headaches (hx of migraine, alternating sides).   Psychiatric/Behavioral:  Positive for sleep disturbance (due to headaches).         No stress              Physical Exam:    Physical Exam  Vitals and nursing note reviewed.   Constitutional:       General: She is not in acute distress.     Appearance: Normal appearance.   HENT:      Head: Normocephalic and atraumatic.      Right Ear: Tympanic membrane and ear canal normal.      Left Ear: Tympanic membrane and ear canal normal.      Nose: Nose normal.  Mouth/Throat:      Mouth: Mucous membranes are moist.      Pharynx: Oropharynx is clear. No oropharyngeal exudate or posterior oropharyngeal erythema.   Eyes:      Extraocular Movements: Extraocular movements intact.      Conjunctiva/sclera: Conjunctivae normal.      Pupils: Pupils are equal, round, and reactive to light.   Cardiovascular:      Rate and Rhythm: Normal rate and regular rhythm.      Pulses: Normal pulses.      Heart sounds: Normal heart sounds.   Pulmonary:      Effort: Pulmonary effort is normal.      Breath sounds: Normal breath sounds.   Musculoskeletal:      Cervical back: Normal range of motion and neck supple. Spasms and tenderness (b/l suboccipitals and upper/mid trap) present. Normal range of motion.   Lymphadenopathy:      Cervical: No cervical adenopathy.   Skin:     General: Skin is warm and dry.   Neurological:      General: No focal deficit present.      Mental Status: She is alert and oriented to person, place, and time. Mental status is at baseline.      Cranial Nerves: Cranial nerves are intact. No cranial nerve deficit (II-XII).      Motor: Motor function is intact.      Coordination: Coordination is intact.      Gait: Gait is intact.   Psychiatric:         Mood and Affect: Mood normal.         Behavior: Behavior normal.         Thought Content: Thought content normal.         Judgment: Judgment normal.              Assessment/Plan:    1.  Nonintractable headache, unspecified chronicity pattern, unspecified headache type  - cyclobenzaprine (FLEXERIL) 10 MG tablet; Take 1 tablet (10 mg) by mouth 3 (three) times daily as needed for Muscle spasms  Dispense: 30 tablet; Refill: 0  - butalbital-acetaminophen-caffeine (FIORICET) 50-325-40 MG per tablet; Take 1 tablet by mouth every 4 (four) hours as needed for Headaches  Dispense: 30 tablet; Refill: 1    2. Migraine with aura and without status migrainosus, not intractable  - cyclobenzaprine (FLEXERIL) 10 MG tablet; Take 1 tablet (10 mg) by mouth 3 (three) times daily as needed for Muscle spasms  Dispense: 30 tablet; Refill: 0  - butalbital-acetaminophen-caffeine (FIORICET) 50-325-40 MG per tablet; Take 1 tablet by mouth every 4 (four) hours as needed for Headaches  Dispense: 30 tablet; Refill: 1    - hx of migraine, alternating sides, with visual aura. 9 days ago started having headaches, posterior and temples  - noted to have tightness/spasms of b/l suboccipitals and upper/mid trap and suspect likely tension type HA/migraines  - neuro exam normal today. No focal neuro deficits  - trial of meds as above; s/e and instruction reviewed  - emphasized importance of stress reduction and staying well hydrated  - heat to shoulders/forehead and temples; ice to forehead/temples  - advised to write HA journals to identify possible triggers  - consider neuro consult if sx persist/worsen despite tx above; contact list provided  - f/u or call office if HA not responding to analgesics or if changes in frequency or pattern of HA or to ER if thunderclap HA, systemic s/s, cognitive changes  Follow-up:    No follow-ups on file.         Sherryle Lis, FNP                     [1] There is no problem list on file for this patient.   [2]   Outpatient Medications Marked as Taking for the 06/26/23 encounter (Office Visit) with Sherryle Lis, FNP   Medication Sig Dispense Refill    levonorgestrel (MIRENA) 20 MCG/DAY IUD 1  Intra Uterine Device (52 mg) by Intrauterine route once     [3]   Allergies  Allergen Reactions    Augmentin [Amoxicillin-Pot Clavulanate] Rash    Pollen Extract    [4]   Social History  Tobacco Use    Smoking status: Never    Smokeless tobacco: Former   Haematologist status: Former   Substance Use Topics    Alcohol use: Yes     Comment: weekends    Drug use: Yes     Types: Marijuana     Comment: 3-5/week

## 2023-09-12 ENCOUNTER — Other Ambulatory Visit (INDEPENDENT_AMBULATORY_CARE_PROVIDER_SITE_OTHER): Payer: Self-pay | Admitting: Medical-Surgical

## 2024-08-14 ENCOUNTER — Other Ambulatory Visit (INDEPENDENT_AMBULATORY_CARE_PROVIDER_SITE_OTHER): Payer: Self-pay | Admitting: Medical-Surgical
